# Patient Record
Sex: Male | Born: 1995 | Race: Black or African American | Hispanic: No | Marital: Single | Smoking: Never smoker
Health system: Southern US, Community
[De-identification: ages and names within clinical notes are randomized; demographics above are authoritative.]

---
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
## 2015-10-17 ENCOUNTER — Emergency Department (INDEPENDENT_AMBULATORY_CARE_PROVIDER_SITE_OTHER)
Admission: EM | Admit: 2015-10-17 | Discharge: 2015-10-17 | Disposition: A | Discharge status: Home / Self Care | Payer: BLUE CROSS/BLUE SHIELD | Source: Home / Self Care

## 2015-10-17 ENCOUNTER — Encounter (HOSPITAL_COMMUNITY): Payer: Self-pay

## 2015-10-17 DIAGNOSIS — J209 Acute bronchitis, unspecified: Secondary | ICD-10-CM | POA: Diagnosis not present

## 2015-10-17 MED ORDER — AMOXICILLIN 500 MG PO CAPS: 500.0000 mg | ORAL_CAPSULE | Freq: Three times a day (TID) | ORAL | Status: DC

## 2015-10-17 NOTE — ED Notes (Signed)
PA eval only

## 2015-10-17 NOTE — Discharge Instructions (Signed)

## 2015-12-14 ENCOUNTER — Emergency Department (INDEPENDENT_AMBULATORY_CARE_PROVIDER_SITE_OTHER)
Admission: EM | Admit: 2015-12-14 | Discharge: 2015-12-14 | Disposition: A | Discharge status: Home / Self Care | Payer: BLUE CROSS/BLUE SHIELD | Source: Home / Self Care | Attending: Emergency Medicine | Admitting: Emergency Medicine

## 2015-12-14 ENCOUNTER — Encounter (HOSPITAL_COMMUNITY): Payer: Self-pay | Admitting: Emergency Medicine

## 2015-12-14 DIAGNOSIS — H6691 Otitis media, unspecified, right ear: Secondary | ICD-10-CM

## 2015-12-14 NOTE — ED Notes (Signed)
C/o right ear pain onset 2 weeks  Denies fevers, cold sx A&O x4... No acute distress.

## 2015-12-14 NOTE — ED Provider Notes (Signed)
CSN: [id number]     Arrival date & time 12/14/15  1442 History   First MD Initiated Contact with Patient 12/14/15 1545     Chief Complaint  Patient presents with  . Otalgia   (Consider location/radiation/quality/duration/timing/severity/associated sxs/prior Treatment) HPI History obtained from patient:   LOCATION: Right ear SEVERITY: 3 DURATION: Pain for about 2 weeks CONTEXT: Recent URI QUALITY: Aching MODIFYING FACTORS: None ASSOCIATED SYMPTOMS: Cough TIMING: Constant OCCUPATION: [employer] History reviewed. No pertinent past medical history. History reviewed. No pertinent past surgical history. No family history on file. Social History  Substance Use Topics  . Smoking status: Never Smoker   . Smokeless tobacco: None  . Alcohol Use: No    Review of Systems ROS +'ve ear ache  Denies: HEADACHE, NAUSEA, ABDOMINAL PAIN, CHEST PAIN, CONGESTION, DYSURIA, SHORTNESS OF BREATH  Allergies  Review of patient's allergies indicates no known allergies.  Home Medications   Prior to Admission medications   Medication Sig Start Date End Date Taking? Authorizing Provider  amoxicillin (AMOXIL) 500 MG capsule Take 1 capsule (500 mg total) by mouth 3 (three) times daily. 10/17/15   Tharon AquasFrank C Patrick, PA   Meds Ordered and Administered this Visit  Medications - No data to display  BP 124/71 mmHg  Pulse 75  Temp(Src) 98.2 F (36.8 C) (Oral)  Resp 18  SpO2 100% No data found.   Physical Exam  Constitutional: He appears well-developed and well-nourished. No distress.  HENT:  Head: Normocephalic and atraumatic.  Right Ear: Tympanic membrane is injected, erythematous and bulging. Tympanic membrane mobility is abnormal.  Left Ear: External ear normal.    ED Course  Procedures (including critical care time)  Labs Review Labs Reviewed - No data to display  Imaging Review No results found.   Visual Acuity Review  Right Eye Distance:   Left Eye Distance:   Bilateral  Distance:    Right Eye Near:   Left Eye Near:    Bilateral Near:         MDM   1. Acute right otitis media, recurrence not specified, unspecified otitis media type    Patient is advised to continue home symptomatic treatment. Prescription for amoxil  sent pharmacy patient has indicated. Patient is advised that if there are new or worsening symptoms or attend the emergency department, or contact primary care provider. Instructions of care provided discharged home in stable condition.  THIS NOTE WAS GENERATED USING A VOICE RECOGNITION SOFTWARE PROGRAM. ALL REASONABLE EFFORTS  WERE MADE TO PROOFREAD THIS DOCUMENT FOR ACCURACY.     Tharon AquasFrank C Patrick, PA 12/14/15 [phone]

## 2015-12-14 NOTE — Discharge Instructions (Signed)
Otitis Media, Adult °Otitis media is redness, soreness, and puffiness (swelling) in the space just behind your eardrum (middle ear). It may be caused by allergies or infection. It often happens along with a cold. °HOME CARE °· Take your medicine as told. Finish it even if you start to feel better. °· Only take over-the-counter or prescription medicines for pain, discomfort, or fever as told by your doctor. °· Follow up with your doctor as told. °GET HELP IF: °· You have otitis media only in one ear, or bleeding from your nose, or both. °· You notice a lump on your neck. °· You are not getting better in 3-5 days. °· You feel worse instead of better. °GET HELP RIGHT AWAY IF:  °· You have pain that is not helped with medicine. °· You have puffiness, redness, or pain around your ear. °· You get a stiff neck. °· You cannot move part of your face (paralysis). °· You notice that the bone behind your ear hurts when you touch it. °MAKE SURE YOU:  °· Understand these instructions. °· Will watch your condition. °· Will get help right away if you are not doing well or get worse. °  °This information is not intended to replace advice given to you by your health care provider. Make sure you discuss any questions you have with your health care provider. °  °Document Released: 05/10/2008 Document Revised: 12/13/2014 Document Reviewed: 06/19/2013 °Elsevier Interactive Patient Education ©2016 [employer] ° ° °

## 2015-12-18 ENCOUNTER — Encounter (HOSPITAL_COMMUNITY): Payer: Self-pay | Admitting: *Deleted

## 2015-12-18 ENCOUNTER — Emergency Department (HOSPITAL_COMMUNITY)
Admission: EM | Admit: 2015-12-18 | Discharge: 2015-12-18 | Disposition: A | Discharge status: Home / Self Care | Payer: BLUE CROSS/BLUE SHIELD | Attending: Emergency Medicine | Admitting: Emergency Medicine

## 2015-12-18 DIAGNOSIS — H6691 Otitis media, unspecified, right ear: Secondary | ICD-10-CM | POA: Diagnosis not present

## 2015-12-18 DIAGNOSIS — H9209 Otalgia, unspecified ear: Secondary | ICD-10-CM | POA: Diagnosis present

## 2015-12-18 MED ORDER — AMOXICILLIN 500 MG PO CAPS: 500.0000 mg | ORAL_CAPSULE | Freq: Two times a day (BID) | ORAL | Status: DC

## 2015-12-18 NOTE — ED Notes (Signed)
Pt reports he does not know when the ear pain started . Pt has a visit with [facility] in system but [facility]

## 2015-12-18 NOTE — ED Provider Notes (Signed)
CSN: [id number]     Arrival date & time 12/18/15  1423 History   First MD Initiated Contact with Patient 12/18/15 1511     Chief Complaint  Patient presents with  . Otalgia   (Consider location/radiation/quality/duration/timing/severity/associated sxs/prior Treatment) HPI 20 y.o. male presents to the Emergency Department today complaining of Otalgia. He was seen previously at an [facility] on 12/14/15 for similar complaint. States that he was give na perscription for Amoxicillin, but was not able to fill it out as the pharmacy said they hadnt recevied a request. Pt as had symptoms for 2 weeks. H/A, cough. No fever, no N/V/D, no abd pain, no dyspnea. Says it sounds like he is underwater. No other symptoms noted.   History reviewed. No pertinent past medical history. History reviewed. No pertinent past surgical history. History reviewed. No pertinent family history. Social History  Substance Use Topics  . Smoking status: Never Smoker   . Smokeless tobacco: None  . Alcohol Use: No    Review of Systems 10 Systems reviewed and all are negative for acute change except as noted in the HPI.  Allergies  Review of patient's allergies indicates no known allergies.  Home Medications   Prior to Admission medications   Not on File   BP 131/67 mmHg  Pulse 97  Temp(Src) 98.2 F (36.8 C) (Oral)  Resp 16  SpO2 100%   Physical Exam  Constitutional: He is oriented to person, place, and time. He appears well-developed and well-nourished.  HENT:  Head: Normocephalic and atraumatic.  Right Ear: External ear and ear canal normal. No foreign bodies. Tympanic membrane is injected and erythematous. Tympanic membrane is not perforated and not bulging. No decreased hearing is noted.  Left Ear: Tympanic membrane, external ear and ear canal normal. No decreased hearing is noted.  Nose: Nose normal.  Mouth/Throat: Uvula is midline, oropharynx is clear and moist and mucous membranes are normal.  Eyes: EOM are  normal.  Cardiovascular: Normal rate and regular rhythm.   Pulmonary/Chest: Effort normal.  Abdominal: Soft.  Musculoskeletal: Normal range of motion.  Neurological: He is alert and oriented to person, place, and time.  Skin: Skin is warm and dry.  Psychiatric: He has a normal mood and affect. His behavior is normal. Thought content normal.  Nursing note and vitals reviewed.   ED Course  Procedures (including critical care time) Labs Review Labs Reviewed - No data to display  Imaging Review No results found. I have personally reviewed and evaluated these images and lab results as part of my medical decision-making.   EKG Interpretation None     MDM  I have reviewed the relevant previous healthcare records. I obtained HPI from historian.  ED Course:  Assessment: 19y M previously at an [facility] on 12/14/15 for similar complaint.Given a perscription for Amoxicillin, but was not able to fill it out as the pharmacy said they hadnt recevied a request. Pt as had symptoms for 2 weeks. H/A, cough. afebrile, no N/V/D, no abd pain, no dyspnea. Sx consisted with R Otitis Media. Will give Rx for Amoxicillin and symptomatic treatment.   Patient is in no acute distress. Vital Signs are stable. Patient is able to ambulate. Patient able to tolerate PO.   Disposition/Plan:  DC Home Additional Verbal discharge instructions given and discussed with patient.  Pt Instructed to f/u with PCP in the next 48 hours for evaluation and treatment of symptoms. Return precautions given Pt acknowledges and agrees with plan   Supervising Physician Melene Plan,  DO   Final diagnoses:  Acute right otitis media, recurrence not specified, unspecified otitis media type    Audry Piliyler Dominick Morella, PA-C 12/18/15 1606  Melene Planan Floyd, DO 12/18/15 [id number]
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
## 2015-12-18 NOTE — ED Notes (Signed)
Pt stable, ambulatory, states understanding of discharge instructions 

## 2016-11-07 ENCOUNTER — Emergency Department (HOSPITAL_COMMUNITY)
Admission: EM | Admit: 2016-11-07 | Discharge: 2016-11-07 | Disposition: A | Discharge status: Home / Self Care | Payer: BLUE CROSS/BLUE SHIELD | Attending: Physician Assistant | Admitting: Physician Assistant

## 2016-11-07 ENCOUNTER — Encounter (HOSPITAL_COMMUNITY): Payer: Self-pay

## 2016-11-07 DIAGNOSIS — S3992XA Unspecified injury of lower back, initial encounter: Secondary | ICD-10-CM | POA: Diagnosis not present

## 2016-11-07 DIAGNOSIS — Y939 Activity, unspecified: Secondary | ICD-10-CM | POA: Diagnosis not present

## 2016-11-07 DIAGNOSIS — Y999 Unspecified external cause status: Secondary | ICD-10-CM | POA: Diagnosis not present

## 2016-11-07 DIAGNOSIS — J45909 Unspecified asthma, uncomplicated: Secondary | ICD-10-CM | POA: Diagnosis not present

## 2016-11-07 DIAGNOSIS — Y92410 Unspecified street and highway as the place of occurrence of the external cause: Secondary | ICD-10-CM | POA: Diagnosis not present

## 2016-11-07 MED ORDER — IBUPROFEN 800 MG PO TABS: 800.0000 mg | ORAL_TABLET | Freq: Three times a day (TID) | ORAL | 0 refills | Status: DC

## 2016-11-07 MED ORDER — IBUPROFEN 400 MG PO TABS
800.0000 mg | ORAL_TABLET | Freq: Once | ORAL | Status: AC
  Administered 2016-11-07: 800 mg via ORAL
  Filled 2016-11-07: qty 2

## 2016-11-07 MED ORDER — CYCLOBENZAPRINE HCL 10 MG PO TABS: 10.0000 mg | ORAL_TABLET | Freq: Two times a day (BID) | ORAL | 0 refills | Status: DC | PRN

## 2016-11-07 NOTE — Discharge Instructions (Signed)
Please return with any concerns- weakness numbness or increasing pain.
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
## 2019-10-21 ENCOUNTER — Ambulatory Visit (HOSPITAL_COMMUNITY)
Admission: EM | Admit: 2019-10-21 | Discharge: 2019-10-21 | Disposition: A | Discharge status: Home / Self Care | Payer: BLUE CROSS/BLUE SHIELD | Attending: Emergency Medicine | Admitting: Emergency Medicine

## 2019-10-21 ENCOUNTER — Other Ambulatory Visit: Payer: Self-pay

## 2019-10-21 ENCOUNTER — Encounter (HOSPITAL_COMMUNITY): Payer: Self-pay | Admitting: Emergency Medicine

## 2019-10-21 DIAGNOSIS — L02214 Cutaneous abscess of groin: Secondary | ICD-10-CM

## 2019-10-21 MED ORDER — MUPIROCIN 2 % EX OINT: 1.0000 "application " | TOPICAL_OINTMENT | Freq: Two times a day (BID) | CUTANEOUS | 0 refills | Status: DC

## 2019-10-21 MED ORDER — LIDOCAINE-EPINEPHRINE (PF) 2 %-1:200000 IJ SOLN
INTRAMUSCULAR | Status: AC
  Filled 2019-10-21: qty 20

## 2019-10-21 MED ORDER — AMOXICILLIN-POT CLAVULANATE 875-125 MG PO TABS: 1.0000 | ORAL_TABLET | Freq: Two times a day (BID) | ORAL | 0 refills | Status: DC

## 2019-10-21 MED ORDER — BACITRACIN ZINC 500 UNIT/GM EX OINT
TOPICAL_OINTMENT | CUTANEOUS | Status: AC
  Filled 2019-10-21: qty 2.7

## 2019-10-21 NOTE — ED Triage Notes (Signed)
Pt states on Thursday he noticed a spot on his R groin that felt like a knot, thinks it might be a hernia. Denies definitive injury, just happened to notice it.

## 2019-10-21 NOTE — ED Provider Notes (Signed)
[place]    CSN: [id number] Arrival date & time: 10/21/19  1736      History   Chief Complaint Chief Complaint  Patient presents with  . Abdominal Pain    HPI Michael Browning is a 23 y.o. male.   23 year old male comes in for possible inguinal hernia with abdominal pain.  States 5 days ago, noticed a bulge to the right grain that felt like a knot, and is worried for hernia.  Area is tender to palpation.  States work requires heavy lifting, and wonders if that could have caused it.  Denies injury/trauma.  Denies penile discharge, penile lesion.  Denies nausea, vomiting.  Denies urinary symptoms such as frequency, dysuria, hematuria.  Last BM today, normal.  Last oral intake 1 hour ago.     History reviewed. No pertinent past medical history.  Patient Active Problem List   Diagnosis Date Noted  . WRIST PAIN, RIGHT 05/21/2008  . 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 ASTHMA 07/13/2007  . FRACTURE, ARM, LEFT 07/10/2007  . RHINITIS, ALLERGIC NOS 03/09/1999  . ECZEMA 03/18/1998    History reviewed. No pertinent surgical history.     Home Medications    Prior to Admission medications   Medication Sig Start Date End Date Taking? Authorizing Provider  amoxicillin-clavulanate (AUGMENTIN) 875-125 MG tablet Take 1 tablet by mouth every 12 (twelve) hours. 10/21/19   Tasia Catchings, Armanie Martine V, PA-C  cyclobenzaprine (FLEXERIL) 10 MG tablet Take 1 tablet (10 mg total) by mouth 2 (two) times daily as needed for muscle spasms. Patient not taking: Reported on 10/21/2019 11/07/16   Mackuen, Courteney Lyn, MD  ibuprofen (ADVIL,MOTRIN) 800 MG tablet Take 1 tablet (800 mg total) by mouth 3 (three) times daily. Patient not taking: Reported on 10/21/2019 11/07/16   Mackuen, Courteney Lyn, MD  mupirocin ointment (BACTROBAN) 2 % Apply 1 application topically 2 (two) times daily. 10/21/19   Ok Edwards, PA-C    Family History No family history on file.  Social History Social History    Tobacco Use  . Smoking status: Never Smoker  . Smokeless tobacco: Never Used  Substance Use Topics  . Alcohol use: No  . Drug use: Not on file     Allergies   Patient has no known allergies.   Review of Systems Review of Systems  Reason unable to perform ROS: See HPI as above.     Physical Exam Triage Vital Signs ED Triage Vitals [10/21/19 1809]  Enc Vitals Group     BP 131/78     Pulse Rate 92     Resp 18     Temp 98.2 F (36.8 C)     Temp src      SpO2 99 %     Weight      Height      Head Circumference      Peak Flow      Pain Score 8     Pain Loc      Pain Edu?      Excl. in [place]?    No data found.  Updated Vital Signs BP 131/78   Pulse 92   Temp 98.2 F (36.8 C)   Resp 18   SpO2 99%   Physical Exam Exam conducted with a chaperone present.  Constitutional:      General: He is not in acute distress.    Appearance: He is well-developed. He is not diaphoretic.  HENT:     Head: Normocephalic and atraumatic.  Eyes:     Conjunctiva/sclera: Conjunctivae normal.     Pupils: Pupils are equal, round, and reactive to light.  Pulmonary:     Effort: Pulmonary effort is normal. No respiratory distress.  Genitourinary:      Comments: No swelling, erythema, pain to the testicles.  Neurological:     Mental Status: He is alert and oriented to person, place, and time.      UC Treatments / Results  Labs (all labs ordered are listed, but only abnormal results are displayed) Labs Reviewed - No data to display  EKG   Radiology No results found.  Procedures Incision and Drainage  Date/Time: 10/21/2019 6:34 PM Performed by: Belinda Fisher, PA-C Authorized by: Domenick Gong, MD   Consent:    Consent obtained:  Verbal   Consent given by:  Patient   Risks discussed:  Bleeding, incomplete drainage, pain, damage to other organs and infection   Alternatives discussed:  Alternative treatment and referral Location:    Type:  Abscess   Size:  2cm x 2cm    Location:  Anogenital   Anogenital location: right groin. Pre-procedure details:    Skin preparation:  Hibiclens Anesthesia (see MAR for exact dosages):    Anesthesia method:  Local infiltration   Local anesthetic:  Lidocaine 1% WITH epi Procedure type:    Complexity:  Simple Procedure details:    Needle aspiration: no     Incision types:  Single straight   Incision depth:  Dermal   Scalpel blade:  11   Wound management:  Probed and deloculated   Drainage:  Purulent   Drainage amount:  Moderate   Wound treatment:  Wound left open   Packing materials:  None Post-procedure details:    Patient tolerance of procedure:  Tolerated well, no immediate complications   (including critical care time)  Medications Ordered in UC Medications  lidocaine-EPINEPHrine (XYLOCAINE W/EPI) 2 %-1:200000 (PF) injection (has no administration in time range)  bacitracin 500 UNIT/GM ointment (has no administration in time range)    Initial Impression / Assessment and Plan / UC Course  I have reviewed the triage vital signs and the nursing notes.  Pertinent labs & imaging results that were available during my care of the patient were reviewed by me and considered in my medical decision making (see chart for details).    Patient tolerated procedure well. Start Augmentin for surrounding cellulitis. Wound care instructions given. Return precautions given. Patient expresses understanding and agrees to plan.   Final Clinical Impressions(s) / UC Diagnoses   Final diagnoses:  Abscess of right groin   ED Prescriptions    Medication Sig Dispense Auth. Provider   amoxicillin-clavulanate (AUGMENTIN) 875-125 MG tablet Take 1 tablet by mouth every 12 (twelve) hours. 14 tablet Keleigh Kazee V, PA-C   mupirocin ointment (BACTROBAN) 2 % Apply 1 application topically 2 (two) times daily. 22 g Belinda Fisher, PA-C     PDMP not reviewed this encounter.   Belinda Fisher, PA-C 10/21/19 1836

## 2019-10-21 NOTE — Discharge Instructions (Signed)
Start Augmentin as directed. You can remove current dressing in 24 hours. Keep wound clean and dry. You can clean gently with soap and water. Do not soak area in water. Dress wound with bactroban. Monitor for spreading redness, increased warmth, increased swelling, fever, follow up for reevaluation needed.

## 2020-07-01 ENCOUNTER — Encounter (HOSPITAL_COMMUNITY): Payer: Self-pay

## 2020-07-01 ENCOUNTER — Ambulatory Visit (HOSPITAL_COMMUNITY)
Admission: EM | Admit: 2020-07-01 | Discharge: 2020-07-01 | Disposition: A | Discharge status: Home / Self Care | Payer: BLUE CROSS/BLUE SHIELD | Attending: Urgent Care | Admitting: Urgent Care

## 2020-07-01 DIAGNOSIS — R059 Cough, unspecified: Secondary | ICD-10-CM

## 2020-07-01 DIAGNOSIS — J069 Acute upper respiratory infection, unspecified: Secondary | ICD-10-CM

## 2020-07-01 DIAGNOSIS — R05 Cough: Secondary | ICD-10-CM

## 2020-07-01 DIAGNOSIS — R0982 Postnasal drip: Secondary | ICD-10-CM

## 2020-07-01 MED ORDER — PROMETHAZINE-DM 6.25-15 MG/5ML PO SYRP: 5.0000 mL | ORAL_SOLUTION | Freq: Every evening | ORAL | 0 refills | Status: AC | PRN

## 2020-07-01 MED ORDER — PSEUDOEPHEDRINE HCL 60 MG PO TABS: 60.0000 mg | ORAL_TABLET | Freq: Three times a day (TID) | ORAL | 0 refills | Status: AC | PRN

## 2020-07-01 MED ORDER — CETIRIZINE HCL 10 MG PO TABS: 10.0000 mg | ORAL_TABLET | Freq: Every day | ORAL | 0 refills | Status: AC

## 2020-07-01 MED ORDER — BENZONATATE 100 MG PO CAPS: 100.0000 mg | ORAL_CAPSULE | Freq: Three times a day (TID) | ORAL | 0 refills | Status: AC | PRN

## 2020-07-01 NOTE — ED Triage Notes (Signed)
Pt presents to UC for cough, itchy throat, and nasal congestion x4 days. Pt states he has been treating with benedryl and robetussin with out relief. Pt denies fever, n/v/d, loss of taste or smell.

## 2020-07-01 NOTE — ED Provider Notes (Signed)
[facility]   MRN: [id number] DOB: 23-Mar-1996  Subjective:   Michael Browning is a 24 y.o. male presenting for 4-day history of dry cough, nasal congestion, scratchy and mildly uncomfortable throat pain.  Denies fever, chest pain, shortness of breath, wheezing, nausea, vomiting, belly pain.  Denies having loss sense of taste and smell.  Has not had Covid vaccination.  He did go get COVID-19 tested earlier today at a different facility.  Denies taking chronic medications.  No Known Allergies  Past medical history of allergies, eczema and asthma.  History reviewed. No pertinent surgical history.  History reviewed. No pertinent family history.  Social History   Tobacco Use  . Smoking status: Never Smoker  . Smokeless tobacco: Never Used  Substance Use Topics  . Alcohol use: No  . Drug use: Not on file    ROS   Objective:   Vitals: BP 123/82 (BP Location: Right Arm)   Pulse 68   Temp 98.2 F (36.8 C) (Oral)   Resp 16   SpO2 100%   Physical Exam Constitutional:      General: He is not in acute distress.    Appearance: Normal appearance. He is well-developed and normal weight. He is not ill-appearing, toxic-appearing or diaphoretic.  HENT:     Head: Normocephalic and atraumatic.     Right Ear: Tympanic membrane, ear canal and external ear normal. There is no impacted cerumen.     Left Ear: Tympanic membrane, ear canal and external ear normal. There is no impacted cerumen.     Nose: Nose normal. No congestion or rhinorrhea.     Mouth/Throat:     Mouth: Mucous membranes are moist.     Pharynx: No oropharyngeal exudate or posterior oropharyngeal erythema.     Comments: Mild postnasal drainage overlying pharynx. Eyes:     General: No scleral icterus.       Right eye: No discharge.        Left eye: No discharge.     Extraocular Movements: Extraocular movements intact.     Conjunctiva/sclera: Conjunctivae normal.     Pupils: Pupils are equal, round, and  reactive to light.  Cardiovascular:     Rate and Rhythm: Normal rate and regular rhythm.     Heart sounds: Normal heart sounds. No murmur heard.  No friction rub. No gallop.   Pulmonary:     Effort: Pulmonary effort is normal. No respiratory distress.     Breath sounds: Normal breath sounds. No stridor. No wheezing, rhonchi or rales.  Musculoskeletal:     Cervical back: Normal range of motion and neck supple. No rigidity. No muscular tenderness.  Neurological:     General: No focal deficit present.     Mental Status: He is alert and oriented to person, place, and time.  Psychiatric:        Mood and Affect: Mood normal.        Behavior: Behavior normal.        Thought Content: Thought content normal.        Judgment: Judgment normal.      Assessment and Plan :   PDMP not reviewed this encounter.  1. Cough   2. Post-nasal drainage   3. Viral URI with cough     Will manage for viral illness such as viral URI, viral syndrome, viral rhinitis, COVID-19. Counseled patient on nature of COVID-19 including modes of transmission, diagnostic testing, management and supportive care.  Offered scripts for symptomatic relief.  Patient  had a history of childhood asthma and no active respiratory symptoms therefore we will hold off on albuterol or steroids.  COVID 19 testing is pending. Counseled patient on potential for adverse effects with medications prescribed/recommended today, ER and return-to-clinic precautions discussed, patient verbalized understanding.     Wallis Bamberg, [place] 07/01/20 1919

## 2020-12-18 ENCOUNTER — Other Ambulatory Visit: Payer: Self-pay

## 2020-12-18 ENCOUNTER — Other Ambulatory Visit: Payer: Medicaid Other

## 2020-12-18 DIAGNOSIS — Z20822 Contact with and (suspected) exposure to covid-19: Secondary | ICD-10-CM

## 2020-12-21 LAB — NOVEL CORONAVIRUS, NAA: SARS-CoV-2, NAA: NOT DETECTED
